# Patient Record
Sex: Male | Born: 1995 | Race: White | Hispanic: No | Marital: Single | Smoking: Never smoker
Health system: Southern US, Community
[De-identification: ages and names within clinical notes are randomized; demographics above are authoritative.]

## PROBLEM LIST (undated history)

## (undated) HISTORY — PX: ANTERIOR CRUCIATE LIGAMENT REPAIR: SHX115

---

## 2018-10-23 ENCOUNTER — Inpatient Hospital Stay
Admission: EM | Admit: 2018-10-23 | Discharge: 2018-10-26 | DRG: 373 | Disposition: A | Discharge status: Home / Self Care | Payer: BLUE CROSS/BLUE SHIELD | Attending: General Surgery | Admitting: General Surgery

## 2018-10-23 ENCOUNTER — Emergency Department: Payer: BLUE CROSS/BLUE SHIELD

## 2018-10-23 ENCOUNTER — Other Ambulatory Visit: Payer: Self-pay

## 2018-10-23 DIAGNOSIS — K5900 Constipation, unspecified: Secondary | ICD-10-CM | POA: Diagnosis present

## 2018-10-23 DIAGNOSIS — K3533 Acute appendicitis with perforation and localized peritonitis, with abscess: Secondary | ICD-10-CM | POA: Diagnosis present

## 2018-10-23 DIAGNOSIS — K3532 Acute appendicitis with perforation and localized peritonitis, without abscess: Secondary | ICD-10-CM | POA: Diagnosis present

## 2018-10-23 LAB — URINALYSIS, COMPLETE (UACMP) WITH MICROSCOPIC
Bilirubin Urine: NEGATIVE
Glucose, UA: NEGATIVE mg/dL
Ketones, ur: NEGATIVE mg/dL
Leukocytes, UA: NEGATIVE
Nitrite: NEGATIVE
PH: 6 (ref 5.0–8.0)
Protein, ur: NEGATIVE mg/dL
SPECIFIC GRAVITY, URINE: 1.014 (ref 1.005–1.030)

## 2018-10-23 LAB — CBC
HEMATOCRIT: 39.5 % (ref 39.0–52.0)
Hemoglobin: 14.3 g/dL (ref 13.0–17.0)
MCH: 30.9 pg (ref 26.0–34.0)
MCHC: 36.2 g/dL — AB (ref 30.0–36.0)
MCV: 85.3 fL (ref 80.0–100.0)
Platelets: 242 10*3/uL (ref 150–400)
RBC: 4.63 MIL/uL (ref 4.22–5.81)
RDW: 11.8 % (ref 11.5–15.5)
WBC: 16.2 10*3/uL — ABNORMAL HIGH (ref 4.0–10.5)
nRBC: 0 % (ref 0.0–0.2)

## 2018-10-23 LAB — COMPREHENSIVE METABOLIC PANEL
ALT: 12 U/L (ref 0–44)
AST: 18 U/L (ref 15–41)
Albumin: 4.2 g/dL (ref 3.5–5.0)
Alkaline Phosphatase: 50 U/L (ref 38–126)
Anion gap: 11 (ref 5–15)
BUN: 13 mg/dL (ref 6–20)
CHLORIDE: 100 mmol/L (ref 98–111)
CO2: 26 mmol/L (ref 22–32)
CREATININE: 1.06 mg/dL (ref 0.61–1.24)
Calcium: 9.4 mg/dL (ref 8.9–10.3)
GFR calc Af Amer: >60 mL/min (ref >60)
Glucose, Bld: 100 mg/dL — ABNORMAL HIGH (ref 70–99)
Potassium: 3.9 mmol/L (ref 3.5–5.1)
SODIUM: 137 mmol/L (ref 135–145)
Total Bilirubin: 1.1 mg/dL (ref 0.3–1.2)
Total Protein: 8.4 g/dL — ABNORMAL HIGH (ref 6.5–8.1)

## 2018-10-23 LAB — LIPASE, BLOOD: Lipase: 26 U/L (ref 11–51)

## 2018-10-23 MED ORDER — HYDROCODONE-ACETAMINOPHEN 5-325 MG PO TABS
1.0000 | ORAL_TABLET | ORAL | Status: DC | PRN
  Administered 2018-10-24 – 2018-10-26 (×3): 2 via ORAL
  Filled 2018-10-23 (×4): qty 2

## 2018-10-23 MED ORDER — ACETAMINOPHEN 325 MG PO TABS: 650.0000 mg | ORAL_TABLET | Freq: Four times a day (QID) | ORAL | Status: DC | PRN

## 2018-10-23 MED ORDER — ONDANSETRON 4 MG PO TBDP: 4.0000 mg | ORAL_TABLET | Freq: Four times a day (QID) | ORAL | Status: DC | PRN

## 2018-10-23 MED ORDER — IOHEXOL 300 MG/ML  SOLN
30.0000 mL | Freq: Once | INTRAMUSCULAR | Status: AC | PRN
  Administered 2018-10-23: 30 mL via ORAL

## 2018-10-23 MED ORDER — PIPERACILLIN-TAZOBACTAM 3.375 G IVPB
3.3750 g | Freq: Three times a day (TID) | INTRAVENOUS | Status: DC
  Administered 2018-10-24 – 2018-10-26 (×7): 3.375 g via INTRAVENOUS
  Filled 2018-10-23 (×7): qty 50

## 2018-10-23 MED ORDER — ONDANSETRON HCL 4 MG/2ML IJ SOLN: 4.0000 mg | Freq: Four times a day (QID) | INTRAMUSCULAR | Status: DC | PRN

## 2018-10-23 MED ORDER — ACETAMINOPHEN 650 MG RE SUPP: 650.0000 mg | Freq: Four times a day (QID) | RECTAL | Status: DC | PRN

## 2018-10-23 MED ORDER — SODIUM CHLORIDE 0.9 % IV SOLN
3.0000 g | Freq: Once | INTRAVENOUS | Status: DC
  Filled 2018-10-23: qty 3

## 2018-10-23 MED ORDER — MORPHINE SULFATE (PF) 2 MG/ML IV SOLN
2.0000 mg | INTRAVENOUS | Status: DC | PRN
  Administered 2018-10-23 – 2018-10-24 (×2): 2 mg via INTRAVENOUS
  Filled 2018-10-23 (×2): qty 1

## 2018-10-23 MED ORDER — SODIUM CHLORIDE 0.9 % IV SOLN
INTRAVENOUS | Status: DC
  Administered 2018-10-23 – 2018-10-25 (×3): via INTRAVENOUS

## 2018-10-23 MED ORDER — IOPAMIDOL (ISOVUE-300) INJECTION 61%
100.0000 mL | Freq: Once | INTRAVENOUS | Status: AC | PRN
  Administered 2018-10-23: 100 mL via INTRAVENOUS

## 2018-10-23 MED ORDER — ENOXAPARIN SODIUM 40 MG/0.4ML ~~LOC~~ SOLN
40.0000 mg | SUBCUTANEOUS | Status: DC
  Administered 2018-10-23: 40 mg via SUBCUTANEOUS
  Filled 2018-10-23 (×2): qty 0.4

## 2018-10-23 NOTE — ED Triage Notes (Signed)
Pt A&O, ambulatory. States constipation and R lower abd pain since Sunday. No N&V. No fevers. Still has abd organs.

## 2018-10-23 NOTE — H&P (Signed)
SURGICAL HISTORY AND PHYSICAL NOTE   HISTORY OF PRESENT ILLNESS (HPI):  22 y.o. male presented to [facility] ED for evaluation of abdominal pain. Patient reports abdominal pain started on 5 days ago (Sunday). It was initially on the mid abdomen and he thought it was his usual constipation. As the days passed it started to got sharper and localized to the right lower quadrant. Pain aggravated with certain position of the abdomen. No alleviator factor identified. On Tuesday he made an appointment with [facility] doctor but there was not available appointment. He waited to be seen today. He referred chills, nausea and vomiting. Denies diarrhea.   Surgery is consulted by Dr. Darnelle Catalan in this context for evaluation and management of acute appendicitis with abscess.  PAST MEDICAL HISTORY (PMH):  History reviewed. No pertinent past medical history.   PAST SURGICAL HISTORY (PSH):  Past Surgical History:  Procedure Laterality Date  . ANTERIOR CRUCIATE LIGAMENT REPAIR       MEDICATIONS:  Prior to Admission medications   Not on File     ALLERGIES:  No Known Allergies   SOCIAL HISTORY:  Social History   Socioeconomic History  . Marital status: Single    Spouse name: Not on file  . Number of children: Not on file  . Years of education: Not on file  . Highest education level: Not on file  Occupational History  . Not on file  Social Needs  . Financial resource strain: Not on file  . Food insecurity:    Worry: Not on file    Inability: Not on file  . Transportation needs:    Medical: Not on file    Non-medical: Not on file  Tobacco Use  . Smoking status: Never Smoker  Substance and Sexual Activity  . Alcohol use: Yes  . Drug use: Not on file  . Sexual activity: Not on file  Lifestyle  . Physical activity:    Days per week: Not on file    Minutes per session: Not on file  . Stress: Not on file  Relationships  . Social connections:    Talks on phone: Not on file    Gets together: Not on  file    Attends religious service: Not on file    Active member of club or organization: Not on file    Attends meetings of clubs or organizations: Not on file    Relationship status: Not on file  . Intimate partner violence:    Fear of current or ex partner: Not on file    Emotionally abused: Not on file    Physically abused: Not on file    Forced sexual activity: Not on file  Other Topics Concern  . Not on file  Social History Narrative  . Not on file    The patient currently resides (home / rehab facility / nursing home): Home The patient normally is (ambulatory / bedbound): Ambulatory   FAMILY HISTORY:  History reviewed. No pertinent family history.   REVIEW OF SYSTEMS:  Constitutional: denies weight loss, fever, chills, or sweats  Eyes: denies any other vision changes, history of eye injury  ENT: denies sore throat, hearing problems  Respiratory: denies shortness of breath, wheezing  Cardiovascular: denies chest pain, palpitations  Gastrointestinal: positive abdominal pain, N/V, denies diarrhea Genitourinary: denies burning with urination or urinary frequency Musculoskeletal: denies any other joint pains or cramps  Skin: denies any other rashes or skin discolorations  Neurological: denies any other headache, dizziness, weakness  Psychiatric: denies  any other depression, anxiety   All other review of systems were negative   VITAL SIGNS:  Temp:  [98.5 F (36.9 C)] 98.5 F (36.9 C) (10/31 1640) Pulse Rate:  [79-121] 79 (10/31 2030) Resp:  [18] 18 (10/31 2024) BP: (129-144)/(77-103) 129/81 (10/31 2030) SpO2:  [100 %] 100 % (10/31 2030) Weight:  [70.3 kg] 70.3 kg (10/31 1640)     Height: 5\' 9"  (175.3 cm) Weight: 70.3 kg BMI (Calculated): 22.88   PHYSICAL EXAM:  Constitutional:  -- Normal body habitus  -- Awake, alert, and oriented x3  Eyes:  -- Pupils equally round and reactive to light  -- No scleral icterus  Ear, nose, and throat:  -- No jugular venous  distension  Pulmonary:  -- No crackles  -- Equal breath sounds bilaterally -- Breathing non-labored at rest Cardiovascular:  -- S1, S2 present  -- No pericardial rubs Gastrointestinal:  -- Abdomen soft, tender on right lower quadrant, non-distended, no guarding or rebound tenderness -- No abdominal masses appreciated, pulsatile or otherwise  Musculoskeletal and Integumentary:  -- Wounds or skin discoloration: None appreciated -- Extremities: B/L UE and LE FROM, hands and feet warm, no edema  Neurologic:  -- Motor function: intact and symmetric -- Sensation: intact and symmetric   Labs:  CBC Latest Ref Rng & Units 10/23/2018  WBC 4.0 - 10.5 K/uL 16.2(H)  Hemoglobin 13.0 - 17.0 g/dL 16.1  Hematocrit 09.6 - 52.0 % 39.5  Platelets 150 - 400 K/uL 242   CMP Latest Ref Rng & Units 10/23/2018  Glucose 70 - 99 mg/dL 045(W)  BUN 6 - 20 mg/dL 13  Creatinine 0.98 - 1.19 mg/dL 1.47  Sodium 829 - 562 mmol/L 137  Potassium 3.5 - 5.1 mmol/L 3.9  Chloride 98 - 111 mmol/L 100  CO2 22 - 32 mmol/L 26  Calcium 8.9 - 10.3 mg/dL 9.4  Total Protein 6.5 - 8.1 g/dL 1.3(Y)  Total Bilirubin 0.3 - 1.2 mg/dL 1.1  Alkaline Phos 38 - 126 U/L 50  AST 15 - 41 U/L 18  ALT 0 - 44 U/L 12    Imaging studies:  EXAM: CT ABDOMEN AND PELVIS WITH CONTRAST  TECHNIQUE: Multidetector CT imaging of the abdomen and pelvis was performed using the standard protocol following bolus administration of intravenous contrast.  CONTRAST:  ISOVUE-300 IOPAMIDOL (ISOVUE-300) INJECTION 61%  COMPARISON:  None.  FINDINGS: Lower chest: No acute abnormality.  Hepatobiliary: No focal liver abnormality is seen. No gallstones, gallbladder wall thickening, or biliary dilatation.  Pancreas: Unremarkable. No pancreatic ductal dilatation or surrounding inflammatory changes.  Spleen: Normal in size without focal abnormality.  Adrenals/Urinary Tract: Adrenal glands are unremarkable. Kidneys are normal,  without renal calculi, focal lesion, or hydronephrosis. Bladder is unremarkable.  Stomach/Bowel:  Appendix: Location: RIGHT lower quadrant  Diameter: 10 mm  Appendicolith: Not Seen  Mucosal hyper-enhancement: Present  Extraluminal gas: No convincing extraluminal air.  Periappendiceal collection: Large amount of fluid and inflammation within the RIGHT lower quadrant. Abscess collection within this inflammation, measuring 2.3 x 1.7 cm.  No dilated large or small bowel loops.  Vascular/Lymphatic: No significant vascular findings are present. No enlarged abdominal or pelvic lymph nodes.  Reproductive: Prostate is unremarkable.  Other: Small amount of additional free fluid in the lower pelvis.  Musculoskeletal: No acute or significant osseous findings.  IMPRESSION: Ruptured appendicitis. Associated abscess collection within the periappendiceal soft tissues measures 2.3 x 1.7 cm. Large amount of associated fluid and inflammatory change in the RIGHT lower quadrant. Small amount  of additional free fluid in the lower pelvis.  These results were called by telephone at the time of interpretation on 10/23/2018 at 9:24 pm to Dr. Dorothea Glassman , who verbally acknowledged these results.   Electronically Signed   By: Bary Richard M.D.   On: 10/23/2018 21:24  Assessment/Plan:  22 y.o. male with acute perforated appendicitis with abscess.  Patient with history, physical exam and images consistent of appendicitis, perforated with abscess. The patient had progression of the disease for 5 days. Now with multiple small abscess. Patient was oriented about CT scan findings and about recommendations of treatment with antibiotic therapy. Will discuss with IR if abscess are amenable for percutaneous drainage but for the size reported they might not be amenable for drainage. Patient oriented about the rationale of not doing surgery at this moment, due to high risk of needing more  extensive colectomy and risk of enterotomies due to severe inflammation. Will admit, start IV antibiotic therapy and IV fluids. When pain improved will start diet and advance as he tolerates.   Gae Gallop, MD

## 2018-10-23 NOTE — ED Provider Notes (Signed)
[facility] Emergency Department Provider Note   ____________________________________________   First MD Initiated Contact with Patient 10/23/18 2012     (approximate)  I have reviewed the triage vital signs and the nursing notes.   HISTORY  Chief Complaint Abdominal Pain    HPI Jose Barnett is a 22 y.o. male patient reports onset of right lower quadrant pain on Sunday.  Today is Thursday.  Pain got worse and worse until yesterday when it started to get better.  Still bothering him though.  Pain is worse with walking or hitting bumps in the road when he drives.  Not feeling well.  Not having vomiting.  Pain is moderate.   History reviewed. No pertinent past medical history.  There are no active problems to display for this patient.   Past Surgical History:  Procedure Laterality Date  . ANTERIOR CRUCIATE LIGAMENT REPAIR      Prior to Admission medications   Not on File    Allergies Patient has no known allergies.  History reviewed. No pertinent family history.  Social History Social History   Tobacco Use  . Smoking status: Never Smoker  Substance Use Topics  . Alcohol use: Yes  . Drug use: Not on file    Review of Systems  Constitutional: No fever/chills Eyes: No visual changes. ENT: No sore throat. Cardiovascular: Denies chest pain. Respiratory: Denies shortness of breath. Gastrointestinal: See HPI Genitourinary: Negative for dysuria. Musculoskeletal: Negative for back pain. Skin: Negative for rash. Neurological: Negative for headaches, focal weakness  ____________________________________________   PHYSICAL EXAM:  VITAL SIGNS: ED Triage Vitals [10/23/18 1640]  Enc Vitals Group     BP (!) 144/103     Pulse Rate (!) 121     Resp 18     Temp 98.5 F (36.9 C)     Temp Source Oral     SpO2 100 %     Weight 155 lb (70.3 kg)     Height 5\' 9"  (1.753 m)     Head Circumference      Peak Flow      Pain Score 0     Pain  Loc      Pain Edu?      Excl. in GC?     Constitutional: Alert and oriented. Well appearing and in no acute distress. Eyes: Conjunctivae are normal. PERRL. EOMI. Head: Atraumatic. Nose: No congestion/rhinnorhea. Mouth/Throat: Mucous membranes are moist.  Oropharynx non-erythematous. Neck: No stridor.   Cardiovascular: Normal rate, regular rhythm. Grossly normal heart sounds.  Good peripheral circulation. Respiratory: Normal respiratory effort.  No retractions. Lungs CTAB. Gastrointestinal: Soft tender to palpation percussion right lower quadrant.. No distention. No abdominal bruits. No CVA tenderness. Musculoskeletal: No lower extremity tenderness nor edema.   Neurologic:  Normal speech and language. No gross focal neurologic deficits are appreciated. No gait instability. Skin:  Skin is warm, dry and intact. No rash noted. Psychiatric: Mood and affect are normal. Speech and behavior are normal.  ____________________________________________   LABS (all labs ordered are listed, but only abnormal results are displayed)  Labs Reviewed  COMPREHENSIVE METABOLIC PANEL - Abnormal; Notable for the following components:      Result Value   Glucose, Bld 100 (*)    Total Protein 8.4 (*)    All other components within normal limits  CBC - Abnormal; Notable for the following components:   WBC 16.2 (*)    MCHC 36.2 (*)    All other components within normal limits  URINALYSIS, COMPLETE (UACMP) WITH MICROSCOPIC - Abnormal; Notable for the following components:   Color, Urine YELLOW (*)    APPearance CLEAR (*)    Hgb urine dipstick SMALL (*)    Bacteria, UA RARE (*)    All other components within normal limits  LIPASE, BLOOD   ____________________________________________  EKG   ____________________________________________  RADIOLOGY  ED MD interpretation: CT read by radiology as perforated appendix with abscess  Official radiology report(s): Dg Abdomen 1 View  Result Date:  10/23/2018 CLINICAL DATA:  Right lower quadrant abdominal pain and constipation since Sunday. EXAM: ABDOMEN - 1 VIEW COMPARISON:  None. FINDINGS: The lung bases are clear. The bowel gas pattern is unremarkable. No findings for obstruction or perforation. There is scattered stool in the colon and down into the rectum. The soft tissue shadows are maintained. No worrisome calcifications. The bony structures are normal. IMPRESSION: Unremarkable abdominal radiographs.  Scattered stool in the colon. Electronically Signed   By: Rudie Meyer M.D.   On: 10/23/2018 19:24   Ct Abdomen Pelvis W Contrast  Result Date: 10/23/2018 CLINICAL DATA:  Elevated white count, RIGHT lower quadrant pain. Generalized abdominal pain with fever. Constipation and RIGHT lower abdominal pain since Sunday. EXAM: CT ABDOMEN AND PELVIS WITH CONTRAST TECHNIQUE: Multidetector CT imaging of the abdomen and pelvis was performed using the standard protocol following bolus administration of intravenous contrast. CONTRAST:  ISOVUE-300 IOPAMIDOL (ISOVUE-300) INJECTION 61% COMPARISON:  None. FINDINGS: Lower chest: No acute abnormality. Hepatobiliary: No focal liver abnormality is seen. No gallstones, gallbladder wall thickening, or biliary dilatation. Pancreas: Unremarkable. No pancreatic ductal dilatation or surrounding inflammatory changes. Spleen: Normal in size without focal abnormality. Adrenals/Urinary Tract: Adrenal glands are unremarkable. Kidneys are normal, without renal calculi, focal lesion, or hydronephrosis. Bladder is unremarkable. Stomach/Bowel: Appendix: Location: RIGHT lower quadrant Diameter: 10 mm Appendicolith: Not Seen Mucosal hyper-enhancement: Present Extraluminal gas: No convincing extraluminal air. Periappendiceal collection: Large amount of fluid and inflammation within the RIGHT lower quadrant. Abscess collection within this inflammation, measuring 2.3 x 1.7 cm. No dilated large or small bowel loops.  Vascular/Lymphatic: No significant vascular findings are present. No enlarged abdominal or pelvic lymph nodes. Reproductive: Prostate is unremarkable. Other: Small amount of additional free fluid in the lower pelvis. Musculoskeletal: No acute or significant osseous findings. IMPRESSION: Ruptured appendicitis. Associated abscess collection within the periappendiceal soft tissues measures 2.3 x 1.7 cm. Large amount of associated fluid and inflammatory change in the RIGHT lower quadrant. Small amount of additional free fluid in the lower pelvis. These results were called by telephone at the time of interpretation on 10/23/2018 at 9:24 pm to Dr. Dorothea Glassman , who verbally acknowledged these results. Electronically Signed   By: Bary Richard M.D.   On: 10/23/2018 21:24    ____________________________________________   PROCEDURES  Procedure(s) performed:   Procedures  Critical Care performed:   ____________________________________________   INITIAL IMPRESSION / ASSESSMENT AND PLAN / ED COURSE  Patient with ruptured appendicitis notify the surgeon he will come down to see the patient.  We will give him some preop antibiotics.         ____________________________________________   FINAL CLINICAL IMPRESSION(S) / ED DIAGNOSES  Final diagnoses:  Acute appendicitis with perforation, localized peritonitis, and gangrene, without abscess     ED Discharge Orders    None       Note:  This document was prepared using Dragon voice recognition software and may include unintentional dictation errors.    Arnaldo Natal, MD 10/23/18  2148  

## 2018-10-24 ENCOUNTER — Inpatient Hospital Stay: Payer: BLUE CROSS/BLUE SHIELD

## 2018-10-24 MED ORDER — FENTANYL CITRATE (PF) 100 MCG/2ML IJ SOLN
INTRAMUSCULAR | Status: AC | PRN
  Administered 2018-10-24: 25 ug via INTRAVENOUS
  Administered 2018-10-24: 50 ug via INTRAVENOUS
  Administered 2018-10-24: 25 ug via INTRAVENOUS

## 2018-10-24 MED ORDER — FENTANYL CITRATE (PF) 100 MCG/2ML IJ SOLN
INTRAMUSCULAR | Status: AC
  Filled 2018-10-24: qty 4

## 2018-10-24 MED ORDER — MIDAZOLAM HCL 5 MG/5ML IJ SOLN
INTRAMUSCULAR | Status: AC
  Filled 2018-10-24: qty 5

## 2018-10-24 MED ORDER — MIDAZOLAM HCL 5 MG/5ML IJ SOLN
INTRAMUSCULAR | Status: AC | PRN
  Administered 2018-10-24 (×3): 1 mg via INTRAVENOUS

## 2018-10-24 NOTE — Progress Notes (Signed)
SURGICAL PROGRESS NOTE   Hospital Day(s): 1.   Post op day(s):  Marland Kitchen   Interval History: Patient seen and examined, no acute events or new complaints overnight. Patient reports feeling well. Denies significant abdominal pain. Denies fever or chills.   Vital signs in last 24 hours: [min-Rickey] current  Temp:  [97.3 F (36.3 C)-98.5 F (36.9 C)] 97.3 F (36.3 C) (11/01 1139) Pulse Rate:  [73-121] 87 (11/01 1139) Resp:  [18-20] 18 (11/01 1139) BP: (101-144)/(62-103) 108/62 (11/01 1139) SpO2:  [100 %] 100 % (11/01 1139) Weight:  [70.3 kg] 70.3 kg (10/31 2308)     Height: 5\' 9"  (175.3 cm) Weight: 70.3 kg BMI (Calculated): 22.88   Physical Exam:  Constitutional: alert, cooperative and no distress  Respiratory: breathing non-labored at rest  Cardiovascular: regular rate and sinus rhythm  Gastrointestinal: soft, mild-tender, and non-distended  Labs:  CBC Latest Ref Rng & Units 10/23/2018  WBC 4.0 - 10.5 K/uL 16.2(H)  Hemoglobin 13.0 - 17.0 g/dL 40.9  Hematocrit 81.1 - 52.0 % 39.5  Platelets 150 - 400 K/uL 242   CMP Latest Ref Rng & Units 10/23/2018  Glucose 70 - 99 mg/dL 914(N)  BUN 6 - 20 mg/dL 13  Creatinine 8.29 - 5.62 mg/dL 1.30  Sodium 865 - 784 mmol/L 137  Potassium 3.5 - 5.1 mmol/L 3.9  Chloride 98 - 111 mmol/L 100  CO2 22 - 32 mmol/L 26  Calcium 8.9 - 10.3 mg/dL 9.4  Total Protein 6.5 - 8.1 g/dL 6.9(G)  Total Bilirubin 0.3 - 1.2 mg/dL 1.1  Alkaline Phos 38 - 126 U/L 50  AST 15 - 41 U/L 18  ALT 0 - 44 U/L 12    Imaging studies: No new pertinent imaging studies   Assessment/Plan:  22 y.o. male with acute perforated appendicitis with abscess.  I personally discussed CT scan with IR and they recommended aspiration of the abscess. Patient with improvement of pain. Will place order for ct scan drainage of abscess and repeat labs in am. Will give diet after study.   Gae Gallop, MD

## 2018-10-24 NOTE — Procedures (Signed)
appy abscess  S/p CT aspiration 5cc bloody pus aspirated No comp Stable cx sent Full report in pacs

## 2018-10-25 LAB — CBC WITH DIFFERENTIAL/PLATELET
Abs Immature Granulocytes: 0.08 10*3/uL — ABNORMAL HIGH (ref 0.00–0.07)
BASOS ABS: 0 10*3/uL (ref 0.0–0.1)
Basophils Relative: 0 %
EOS PCT: 7 %
Eosinophils Absolute: 0.7 10*3/uL — ABNORMAL HIGH (ref 0.0–0.5)
HCT: 34.9 % — ABNORMAL LOW (ref 39.0–52.0)
Hemoglobin: 12.4 g/dL — ABNORMAL LOW (ref 13.0–17.0)
Immature Granulocytes: 1 %
LYMPHS ABS: 2.6 10*3/uL (ref 0.7–4.0)
LYMPHS PCT: 23 %
MCH: 31 pg (ref 26.0–34.0)
MCHC: 35.5 g/dL (ref 30.0–36.0)
MCV: 87.3 fL (ref 80.0–100.0)
MONO ABS: 1 10*3/uL (ref 0.1–1.0)
MONOS PCT: 9 %
NEUTROS ABS: 6.9 10*3/uL (ref 1.7–7.7)
Neutrophils Relative %: 60 %
Platelets: 252 10*3/uL (ref 150–400)
RBC: 4 MIL/uL — AB (ref 4.22–5.81)
RDW: 11.6 % (ref 11.5–15.5)
WBC: 11.4 10*3/uL — AB (ref 4.0–10.5)
nRBC: 0 % (ref 0.0–0.2)

## 2018-10-25 LAB — HIV ANTIBODY (ROUTINE TESTING W REFLEX): HIV Screen 4th Generation wRfx: NONREACTIVE

## 2018-10-25 NOTE — Progress Notes (Signed)
SURGICAL PROGRESS NOTE   Hospital Day(s): 2.   Post op day(s):  Marland Kitchen   Interval History: Patient seen and examined, no acute events or new complaints overnight. Patient reports pain is getting better but still present. Not worsened with oral intake, denies fever or chills. Refers constipation controlled.   Vital signs in last 24 hours: [min-Nery] current  Temp:  [97.3 F (36.3 C)-98.2 F (36.8 C)] 97.7 F (36.5 C) (11/02 0514) Pulse Rate:  [65-88] 65 (11/02 0514) Resp:  [14-20] 20 (11/02 0514) BP: (96-111)/(54-69) 96/62 (11/02 0514) SpO2:  [95 %-100 %] 100 % (11/02 0514)     Height: 5\' 9"  (175.3 cm) Weight: 70.3 kg BMI (Calculated): 22.88    Physical Exam:  Constitutional: alert, cooperative and no distress  Respiratory: breathing non-labored at rest  Cardiovascular: regular rate and sinus rhythm  Gastrointestinal: soft, non-tender, and non-distended  Labs:  CBC Latest Ref Rng & Units 10/25/2018 10/23/2018  WBC 4.0 - 10.5 K/uL 11.4(H) 16.2(H)  Hemoglobin 13.0 - 17.0 g/dL 12.4(L) 14.3  Hematocrit 39.0 - 52.0 % 34.9(L) 39.5  Platelets 150 - 400 K/uL 252 242   CMP Latest Ref Rng & Units 10/23/2018  Glucose 70 - 99 mg/dL 161(W)  BUN 6 - 20 mg/dL 13  Creatinine 9.60 - 4.54 mg/dL 0.98  Sodium 119 - 147 mmol/L 137  Potassium 3.5 - 5.1 mmol/L 3.9  Chloride 98 - 111 mmol/L 100  CO2 22 - 32 mmol/L 26  Calcium 8.9 - 10.3 mg/dL 9.4  Total Protein 6.5 - 8.1 g/dL 8.2(N)  Total Bilirubin 0.3 - 1.2 mg/dL 1.1  Alkaline Phos 38 - 126 U/L 50  AST 15 - 41 U/L 18  ALT 0 - 44 U/L 12    Imaging studies: No new pertinent imaging studies   Assessment/Plan:  22 y.o. male with acute perforated appendicitis with abscess.  Patient had aspiration of abscess yesterday. WBC still elevated but decreasing. Due to the amount of inflammation on CT scan and not normalized WBC count, I will recommend at least 24 more hours of IV antibiotic therapy. Will repeat CBC tomorrow. If normalized and pain  continue to improve, may be discharged with oral antibiotic. If continue elevated or pain gets worse will need to continue IV antibiotic therapy and repeat CT scan will need to be considered to assess formation of new abscess.   Gae Gallop, MD

## 2018-10-26 LAB — CBC WITH DIFFERENTIAL/PLATELET
ABS IMMATURE GRANULOCYTES: 0.07 10*3/uL (ref 0.00–0.07)
BASOS PCT: 1 %
Basophils Absolute: 0 10*3/uL (ref 0.0–0.1)
Eosinophils Absolute: 0.6 10*3/uL — ABNORMAL HIGH (ref 0.0–0.5)
Eosinophils Relative: 7 %
HEMATOCRIT: 33.9 % — AB (ref 39.0–52.0)
HEMOGLOBIN: 12.7 g/dL — AB (ref 13.0–17.0)
Immature Granulocytes: 1 %
LYMPHS ABS: 2.6 10*3/uL (ref 0.7–4.0)
LYMPHS PCT: 31 %
MCH: 31.4 pg (ref 26.0–34.0)
MCHC: 37.5 g/dL — ABNORMAL HIGH (ref 30.0–36.0)
MCV: 83.7 fL (ref 80.0–100.0)
MONO ABS: 0.7 10*3/uL (ref 0.1–1.0)
MONOS PCT: 9 %
NEUTROS PCT: 51 %
Neutro Abs: 4.4 10*3/uL (ref 1.7–7.7)
Platelets: 298 10*3/uL (ref 150–400)
RBC: 4.05 MIL/uL — ABNORMAL LOW (ref 4.22–5.81)
RDW: 11.4 % — ABNORMAL LOW (ref 11.5–15.5)
WBC: 8.4 10*3/uL (ref 4.0–10.5)
nRBC: 0 % (ref 0.0–0.2)

## 2018-10-26 MED ORDER — AMOXICILLIN-POT CLAVULANATE 875-125 MG PO TABS: 1.0000 | ORAL_TABLET | Freq: Two times a day (BID) | ORAL | 0 refills | Status: AC

## 2018-10-26 NOTE — Discharge Instructions (Signed)
°  Diet: Resume soft heart healthy diet.    Activity: No strenuous activity until follow-up, but light activity and walking are encouraged. Do not drive or drink alcohol if taking antibiotic medications.  Medications: Resume all home medications. For mild to moderate pain: acetaminophen (Tylenol) or ibuprofen (if no kidney disease). Combining Tylenol with alcohol can substantially increase your risk of causing liver disease.   Call office [phone]) at any time if any questions, worsening pain, fevers/chills, bleeding, drainage from incision site, or other concerns.

## 2018-10-26 NOTE — Discharge Summary (Signed)
  Patient ID: Jose Barnett MRN: [id number] DOB/AGE: 03/04/1996 22 y.o.  Admit date: 10/23/2018 Discharge date: 10/26/2018   Discharge Diagnoses:  Active Problems:   Acute appendicitis with peritoneal abscess   Procedures: CT guided percutaneous aspiration of peri appendix abscess  Hospital Course: Patient with peroforated appendicitis with small abscess. Patient had CT guided percutaneous drainage. Responded well to antibiotic therapy. WBC count went down to normal level. Pain also significantly improve. Patient tolerating soft diet without pain. Having bowel movements.   Physical Exam  Constitutional: He is well-developed, well-nourished, and in no distress.  Cardiovascular: Normal rate and regular rhythm.  Pulmonary/Chest: Effort normal and breath sounds normal.  Abdominal: Soft. He exhibits no distension. There is no tenderness. There is no rebound.   CBC Latest Ref Rng & Units 10/26/2018 10/25/2018 10/23/2018  WBC 4.0 - 10.5 K/uL 8.4 11.4(H) 16.2(H)  Hemoglobin 13.0 - 17.0 g/dL 12.7(L) 12.4(L) 14.3  Hematocrit 39.0 - 52.0 % 33.9(L) 34.9(L) 39.5  Platelets 150 - 400 K/uL 298 252 242    Consults: Interventional radiologist  EXAM: CT GUIDED RIGHT LOWER QUADRANT APPENDICEAL ABSCESS ASPIRATION  MEDICATIONS: The patient is currently admitted to the hospital and receiving intravenous antibiotics. The antibiotics were administered within an appropriate time frame prior to the initiation of the procedure.  ANESTHESIA/SEDATION: Fentanyl 100 mcg IV; Versed 3.0 mg IV  Moderate Sedation Time:  9 MINUTES  The patient was continuously monitored during the procedure by the interventional radiology nurse under my direct supervision.  COMPLICATIONS: None immediate.  PROCEDURE: Informed written consent was obtained from the patient after a thorough discussion of the procedural risks, benefits and alternatives. All questions were addressed. Maximal Sterile Barrier Technique was  utilized including caps, mask, sterile gowns, sterile gloves, sterile drape, hand hygiene and skin antiseptic. A timeout was performed prior to the initiation of the procedure.  Previous imaging reviewed. Patient positioned supine. Noncontrast localization CT performed. The right lower quadrant small appendiceal abscess was localized. Overlying skin marked for an anterior oblique approach.  Under sterile conditions and local anesthesia, an 18 gauge 10 cm access needle was advanced from an anterior oblique approach into the small appendiceal abscess. Syringe aspiration yielded 5 cc purulent bloody fluid. Needle removed. Patient tolerated the procedure well. No immediate complication.  IMPRESSION: Successful CT-guided right lower quadrant small appendiceal abscess needle aspiration. Sample sent for culture.   Electronically Signed   By: Judie Petit.  Shick M.D.   On: 10/24/2018 15:03  Disposition: Discharge disposition: 01-Home or Self Care      Discharge Instructions    Increase activity slowly   Complete by:  As directed      Allergies as of 10/26/2018   No Known Allergies     Medication List    TAKE these medications   amoxicillin-clavulanate 875-125 MG tablet Commonly known as:  AUGMENTIN Take 1 tablet by mouth 2 (two) times daily for 14 days.      Follow-up Information    Carolan Shiver, MD Follow up in 1 week(s).   Specialty:  General Surgery Contact information: [address] [place] [address] [phone]

## 2018-10-29 ENCOUNTER — Telehealth: Payer: Self-pay

## 2018-10-29 LAB — AEROBIC/ANAEROBIC CULTURE W GRAM STAIN (SURGICAL/DEEP WOUND)

## 2018-10-29 LAB — AEROBIC/ANAEROBIC CULTURE (SURGICAL/DEEP WOUND): SPECIAL REQUESTS: NORMAL

## 2018-10-29 NOTE — Telephone Encounter (Signed)
Flagged on EMMI report for not reading discharge papers.  First attempt to reach patient made, however unable to reach patient or leave a message due to voicemail not being set up. Will attempt at later time.

## 2018-11-04 NOTE — Telephone Encounter (Signed)
Second attempt made, however unable to reach.  No further attempts at this time.  

## 2020-05-02 IMAGING — CR DG ABDOMEN 1V
1 series · 2 of 2 positions shown · non-contrast
Comparison: None.

CLINICAL DATA: Right lower quadrant abdominal pain and constipation
since [REDACTED].

EXAM:
ABDOMEN - 1 VIEW

[Series 1: dg abd 1 view · 0.14mm/px · 2 of 2 slices shown]
[im 1/2]
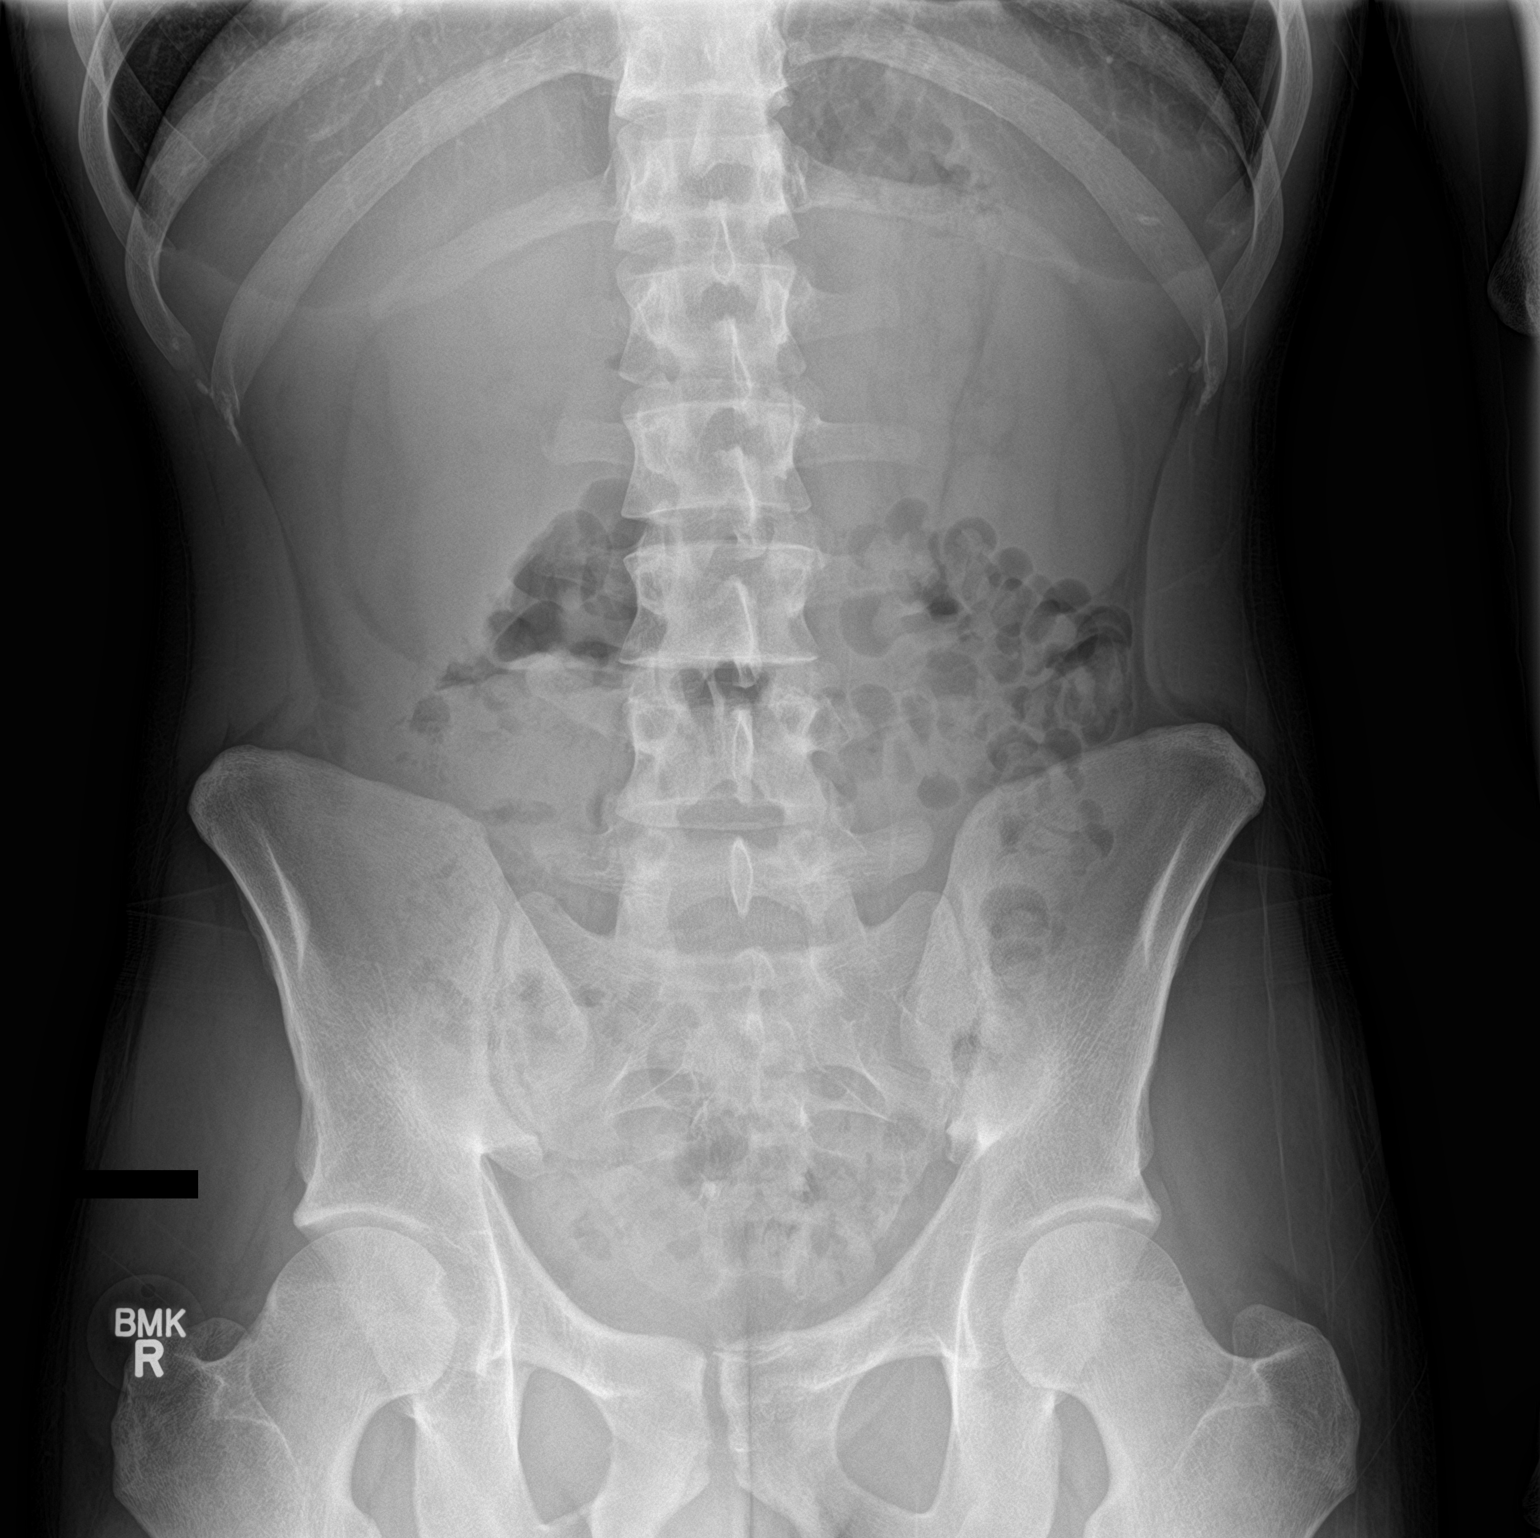
[im 2/2]
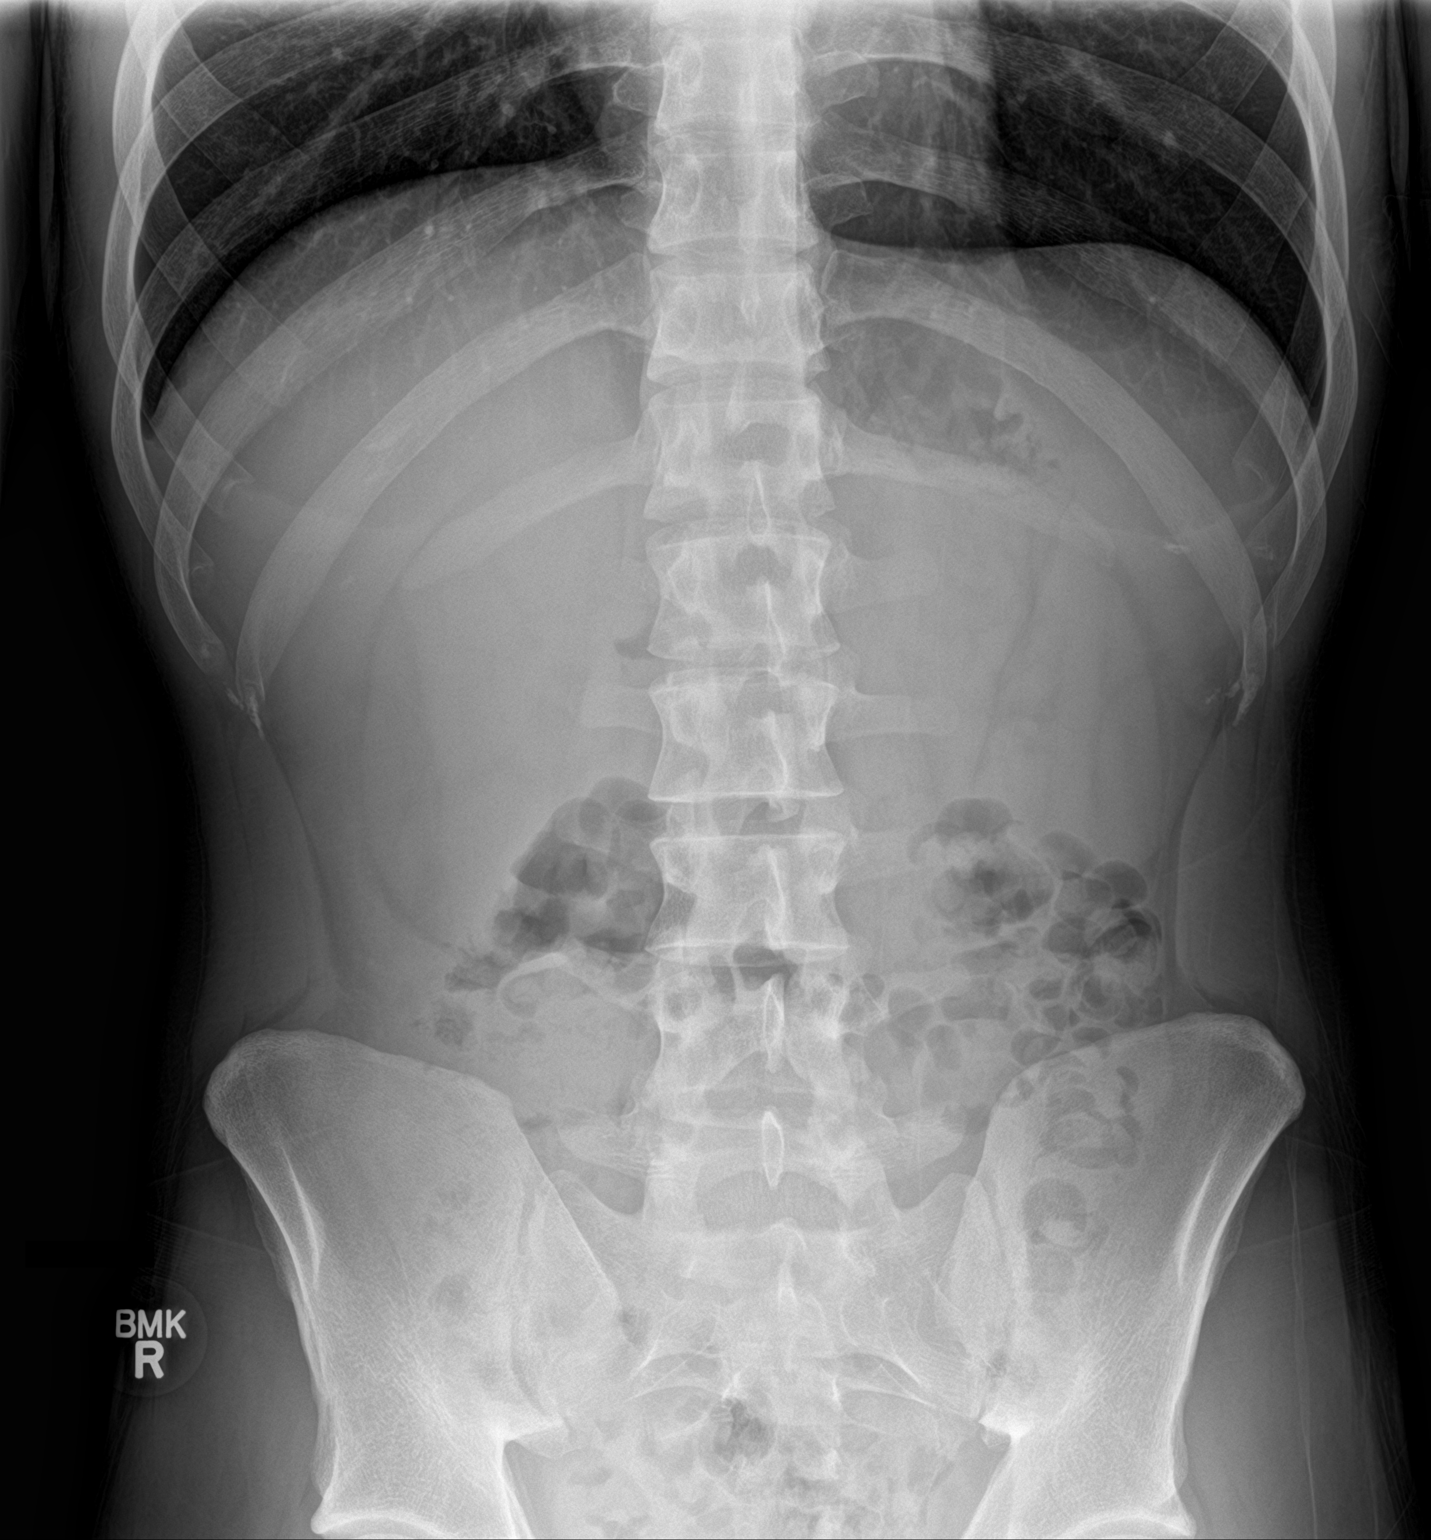

[2 of 2 positions shown; findings below may reference images not displayed]

FINDINGS: The lung bases are clear.

The bowel gas pattern is unremarkable. No findings for obstruction
or perforation. There is scattered stool in the colon and down into
the rectum.

The soft tissue shadows are maintained. No worrisome calcifications.
The bony structures are normal.
IMPRESSION: Unremarkable abdominal radiographs.  Scattered stool in the colon.

## 2020-05-03 IMAGING — CT CT CORE BIOPSY RENAL
1 of 2 series · 14 of 32 positions shown, 19 images · non-contrast
Comparison: none

INDICATION: Acute appendicitis, small periappendiceal abscess

[Series 2: i-spiral 5.0 b30f · axial · 0.59mm/px · z∈[-242,-172]mm · 14 of 24 slices shown, 19 images]
[im 2/24  soft-tissue]
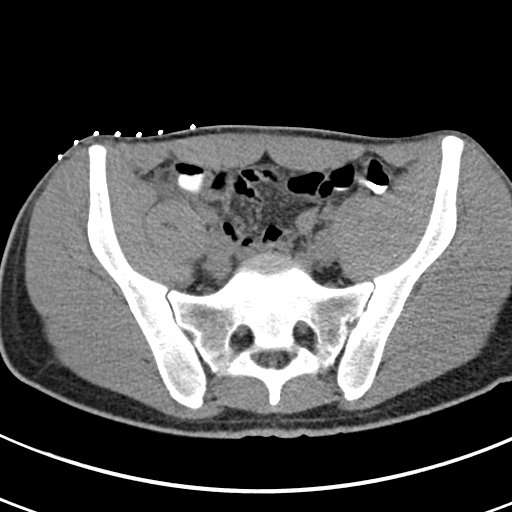
[im 2/24  bone]
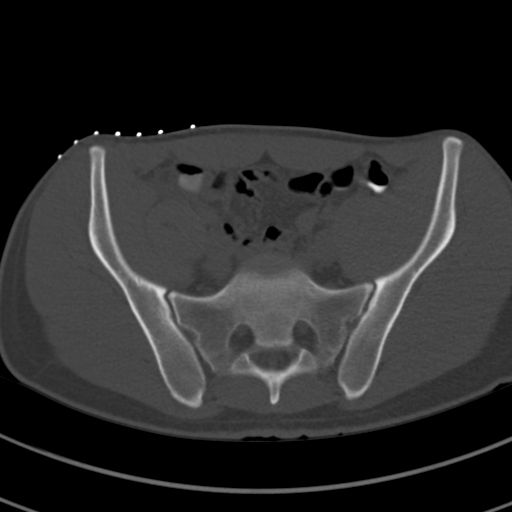
[im 4/24  soft-tissue]
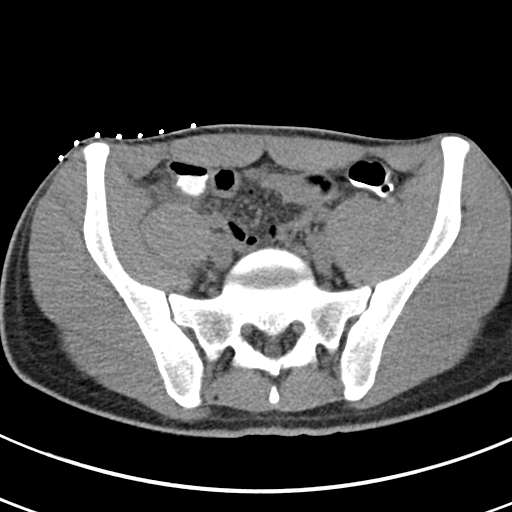
[im 5/24  soft-tissue]
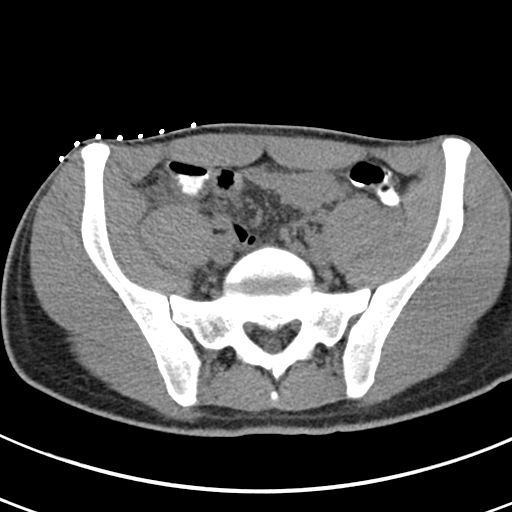
[im 7/24  soft-tissue]
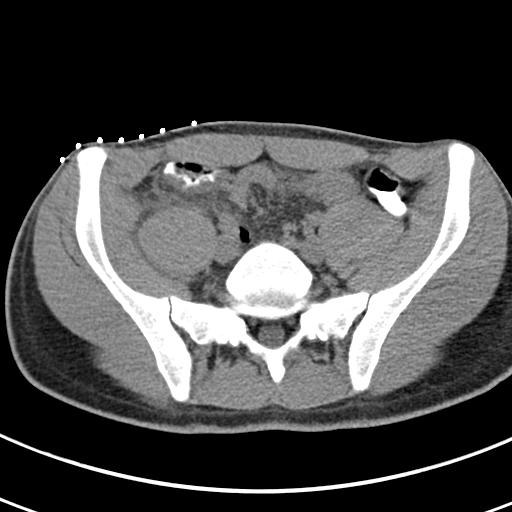
[im 9/24  soft-tissue]
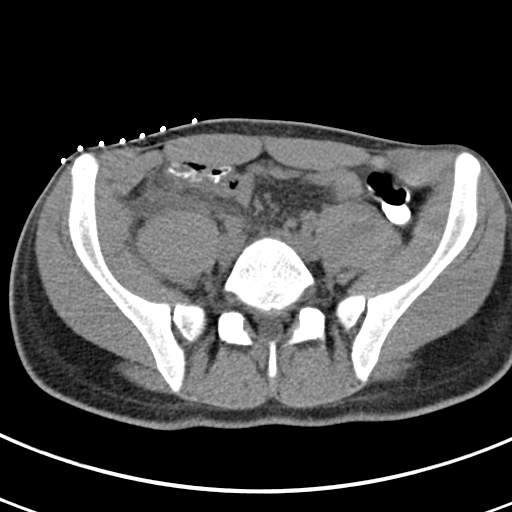
[im 10/24  soft-tissue]
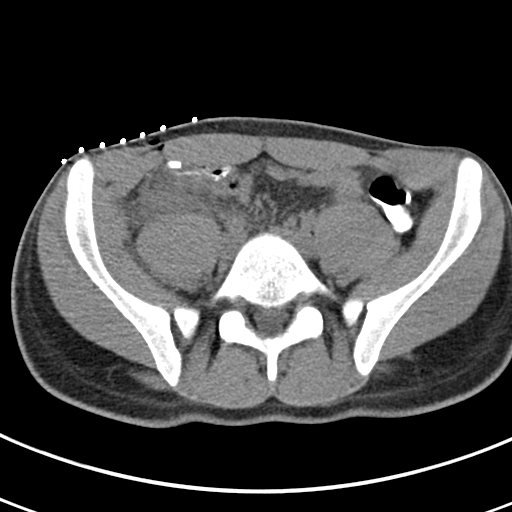
[im 13/24  soft-tissue]
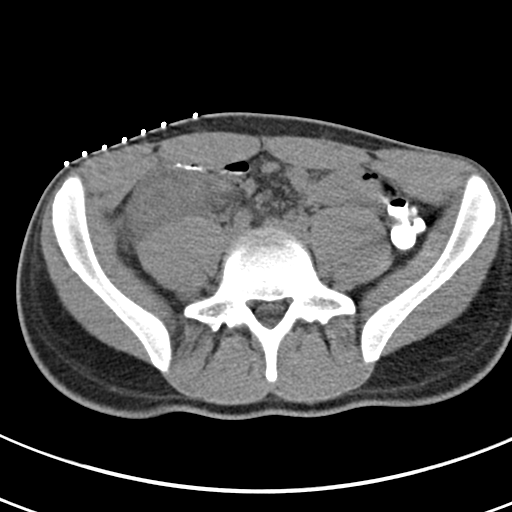
[im 14/24  soft-tissue]
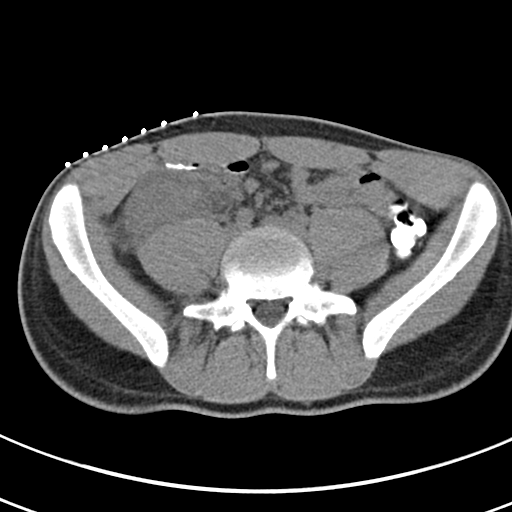
[im 15/24  soft-tissue]
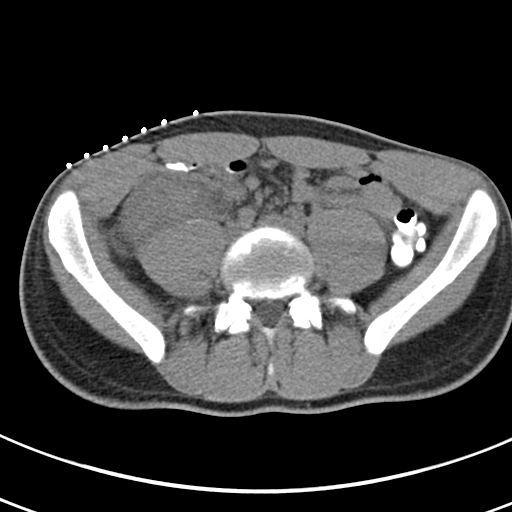
[im 15/24  bone]
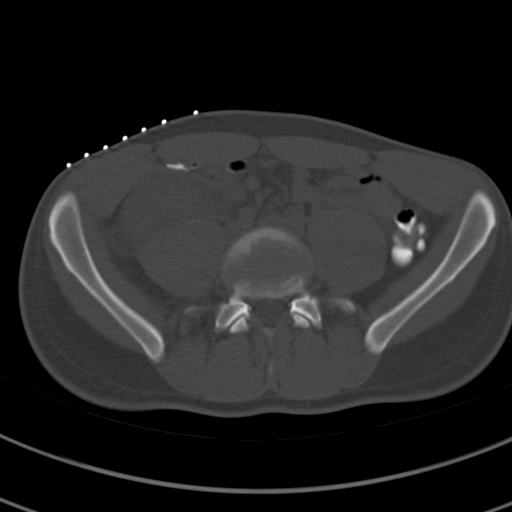
[im 17/24  soft-tissue]
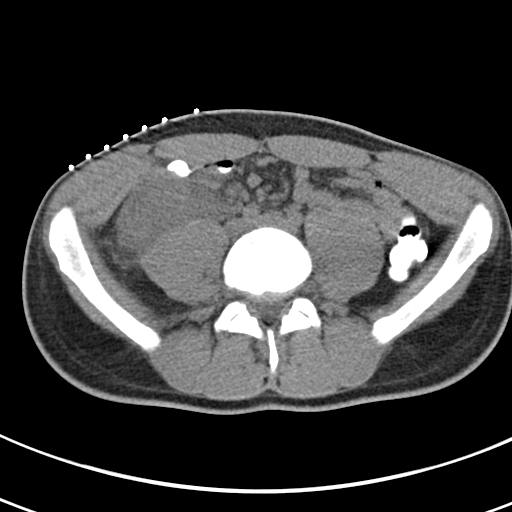
[im 19/24  soft-tissue]
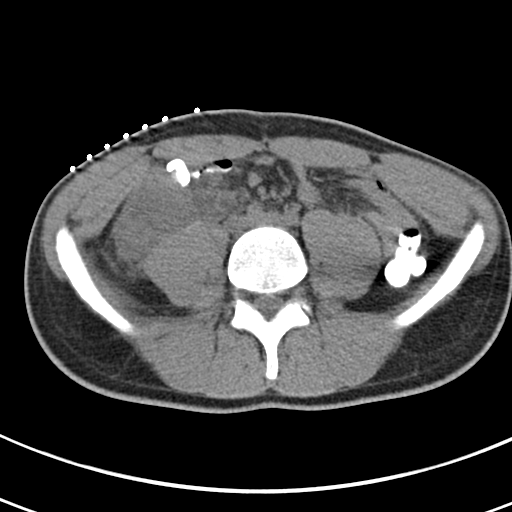
[im 19/24  lung]
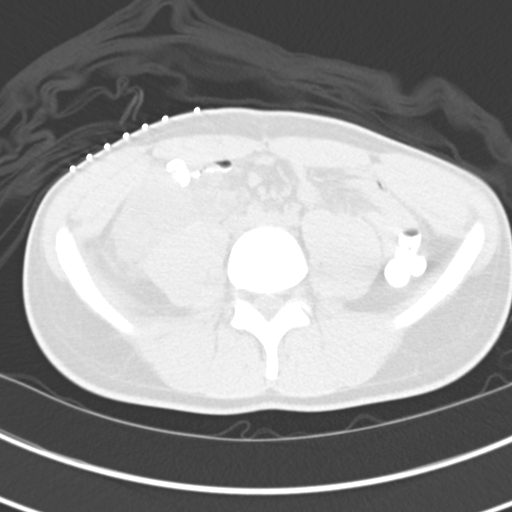
[im 20/24  soft-tissue]
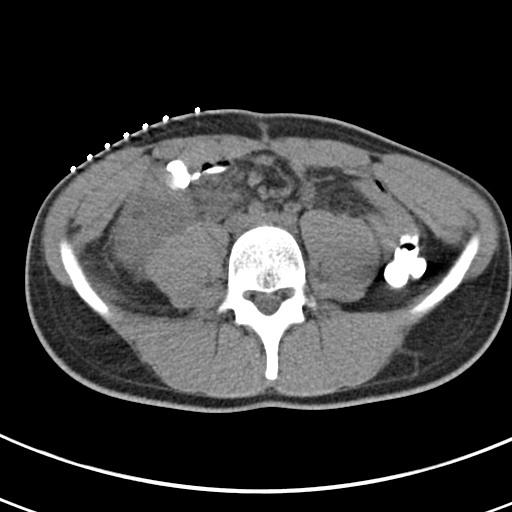
[im 20/24  lung]
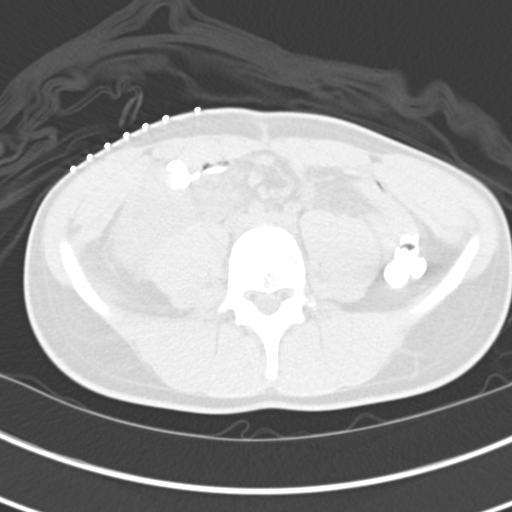
[im 21/24  lung]
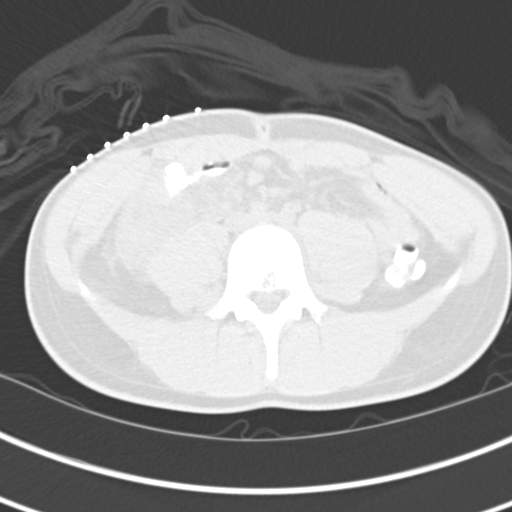
[im 22/24  soft-tissue]
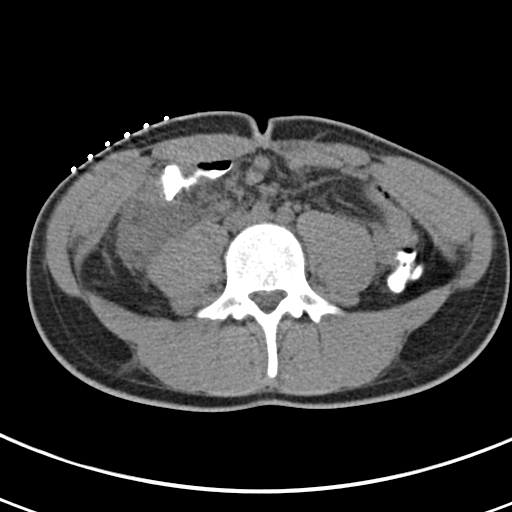
[im 22/24  lung]
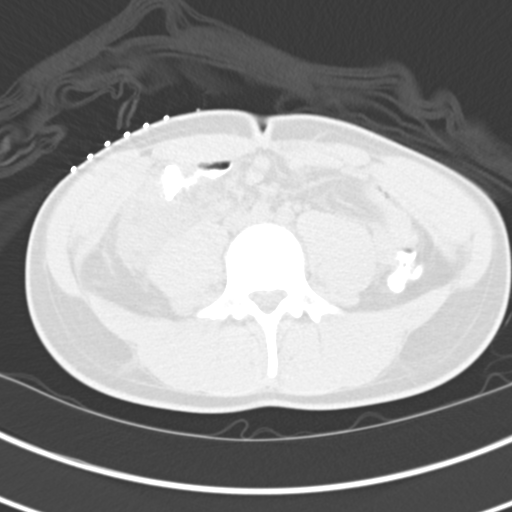

[14 of 32 positions shown; findings below may reference images not displayed]

EXAM:
CT GUIDED RIGHT LOWER QUADRANT APPENDICEAL ABSCESS ASPIRATION

MEDICATIONS:
The patient is currently admitted to the hospital and receiving
intravenous antibiotics. The antibiotics were administered within an
appropriate time frame prior to the initiation of the procedure.

ANESTHESIA/SEDATION:
Fentanyl 100 mcg IV; Versed 3.0 mg IV

Moderate Sedation Time:  9 MINUTES

The patient was continuously monitored during the procedure by the
interventional radiology nurse under my direct supervision.

COMPLICATIONS:
None immediate.

PROCEDURE:
Informed written consent was obtained from the patient after a
thorough discussion of the procedural risks, benefits and
alternatives. All questions were addressed. Maximal Sterile Barrier
Technique was utilized including caps, mask, sterile gowns, sterile
gloves, sterile drape, hand hygiene and skin antiseptic. A timeout
was performed prior to the initiation of the procedure.

Previous imaging reviewed. Patient positioned supine. Noncontrast
localization CT performed. The right lower quadrant small
appendiceal abscess was localized. Overlying skin marked for an
anterior oblique approach.

Under sterile conditions and local anesthesia, an 18 gauge 10 cm
access needle was advanced from an anterior oblique approach into
the small appendiceal abscess. Syringe aspiration yielded 5 cc
purulent bloody fluid. Needle removed. Patient tolerated the
procedure well. No immediate complication.
IMPRESSION: Successful CT-guided right lower quadrant small appendiceal abscess
needle aspiration. Sample sent for culture.
